# Patient Record
Sex: Male | Born: 1990 | Race: Black or African American | Hispanic: No | Marital: Married | Smoking: Never smoker
Health system: Southern US, Community
[De-identification: ages and names within clinical notes are randomized; demographics above are authoritative.]

## PROBLEM LIST (undated history)

## (undated) DIAGNOSIS — J45909 Unspecified asthma, uncomplicated: Secondary | ICD-10-CM

---

## 2011-09-12 ENCOUNTER — Ambulatory Visit
Admission: RE | Admit: 2011-09-12 | Discharge: 2011-09-12 | Disposition: A | Discharge status: Home / Self Care | Payer: BC Managed Care – PPO | Source: Ambulatory Visit | Attending: Family Medicine | Admitting: Family Medicine

## 2011-09-12 ENCOUNTER — Other Ambulatory Visit: Payer: Self-pay | Admitting: Family Medicine

## 2011-09-12 DIAGNOSIS — M545 Low back pain: Secondary | ICD-10-CM

## 2012-03-21 ENCOUNTER — Encounter (HOSPITAL_COMMUNITY): Payer: Self-pay | Admitting: *Deleted

## 2012-03-21 ENCOUNTER — Emergency Department (HOSPITAL_COMMUNITY)
Admission: EM | Admit: 2012-03-21 | Discharge: 2012-03-21 | Disposition: A | Discharge status: Home / Self Care | Payer: BC Managed Care – PPO | Attending: Emergency Medicine | Admitting: Emergency Medicine

## 2012-03-21 DIAGNOSIS — S139XXA Sprain of joints and ligaments of unspecified parts of neck, initial encounter: Secondary | ICD-10-CM | POA: Insufficient documentation

## 2012-03-21 DIAGNOSIS — M542 Cervicalgia: Secondary | ICD-10-CM | POA: Insufficient documentation

## 2012-03-21 DIAGNOSIS — T148XXA Other injury of unspecified body region, initial encounter: Secondary | ICD-10-CM

## 2012-03-21 HISTORY — DX: Unspecified asthma, uncomplicated: J45.909

## 2012-03-21 MED ORDER — IBUPROFEN 800 MG PO TABS
800.0000 mg | ORAL_TABLET | Freq: Once | ORAL | Status: AC
  Administered 2012-03-21: 800 mg via ORAL
  Filled 2012-03-21: qty 1
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
## 2012-03-21 NOTE — ED Notes (Signed)
Girlfriend at bedside.
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
## 2012-03-22 NOTE — ED Provider Notes (Signed)
Medical screening examination/treatment/procedure(s) were performed by non-physician practitioner and as supervising physician I was immediately available for consultation/collaboration.  Tonnie Friedel, MD 03/22/12 1412 

## 2013-02-05 IMAGING — CR DG LUMBAR SPINE COMPLETE 4+V
5 series · 5 of 5 positions shown · non-contrast
Comparison: None.

CLINICAL DATA: Midline low back pain for several years.  Increasing
severity of back pain.

LUMBAR SPINE - COMPLETE 4+ VIEW

[view not recorded (1 of 5)]
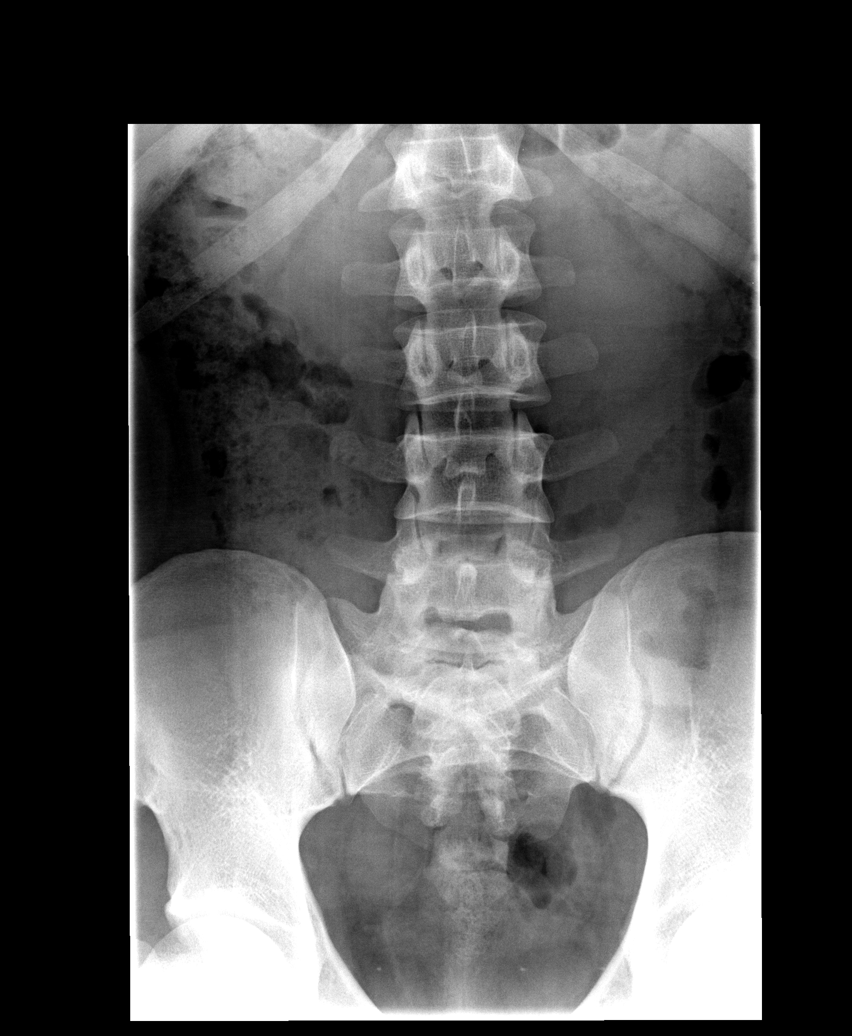

[view not recorded (2 of 5)]
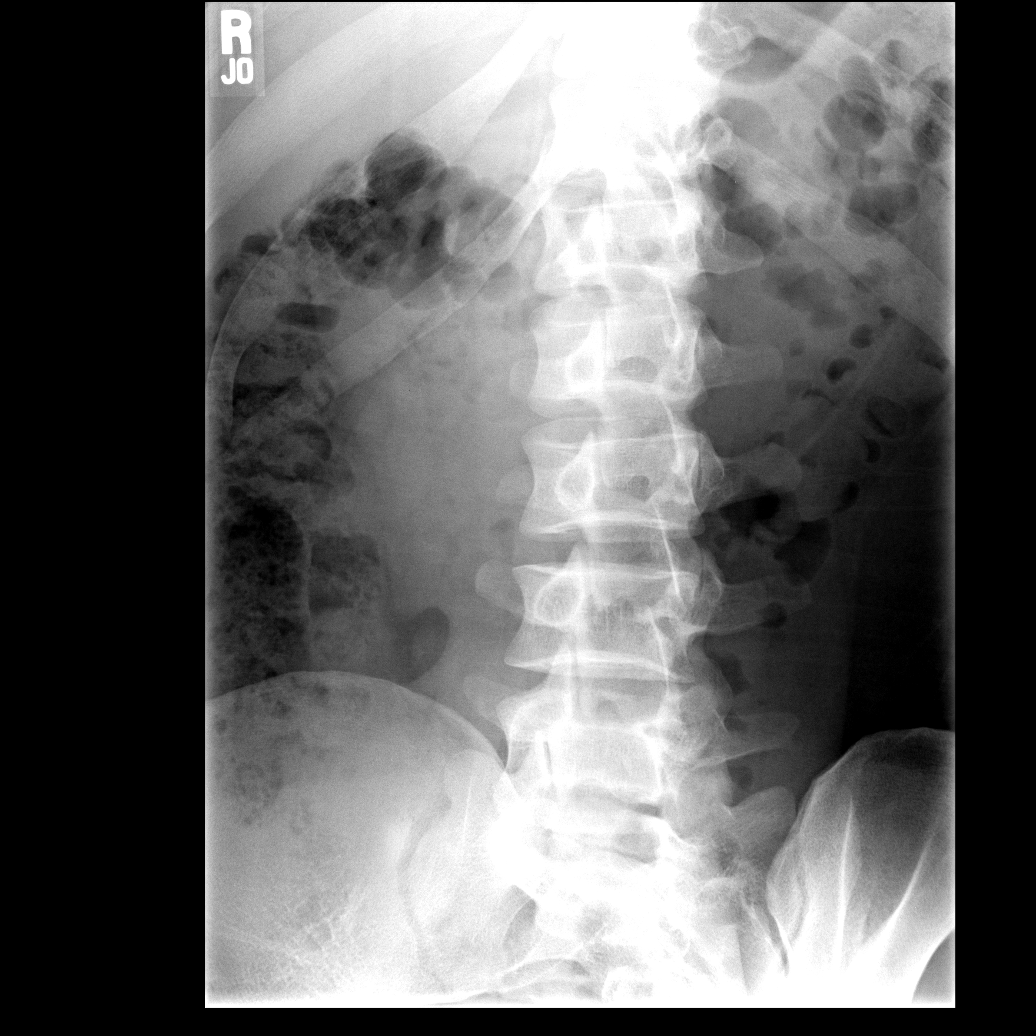

[view not recorded (3 of 5)]
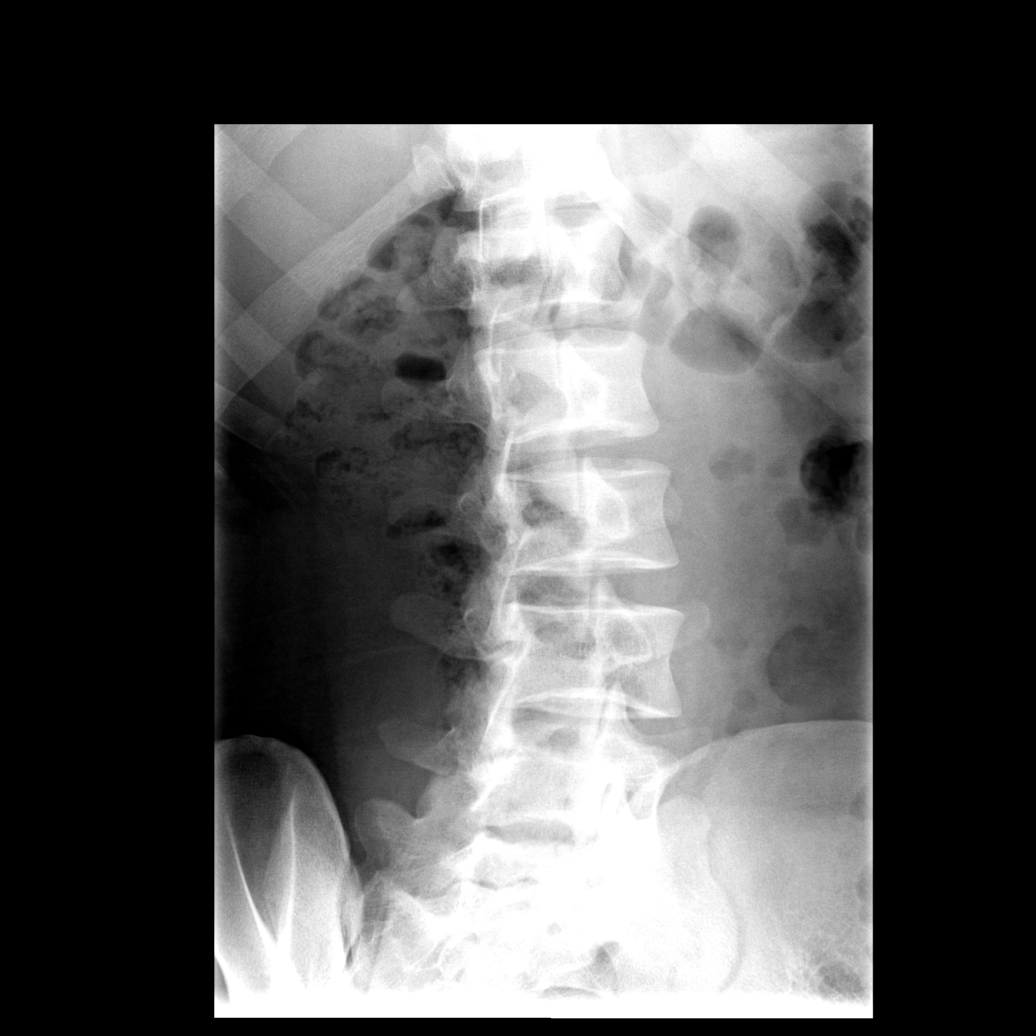

[view not recorded (4 of 5)]
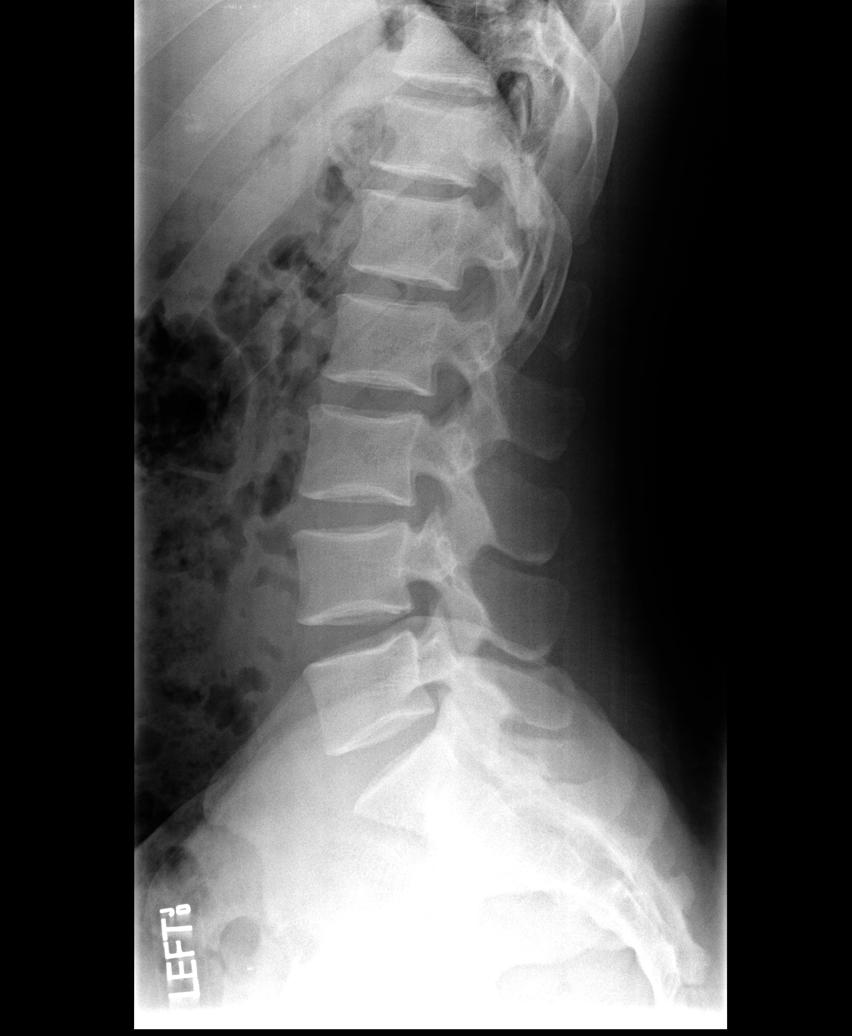

[view not recorded (5 of 5)]
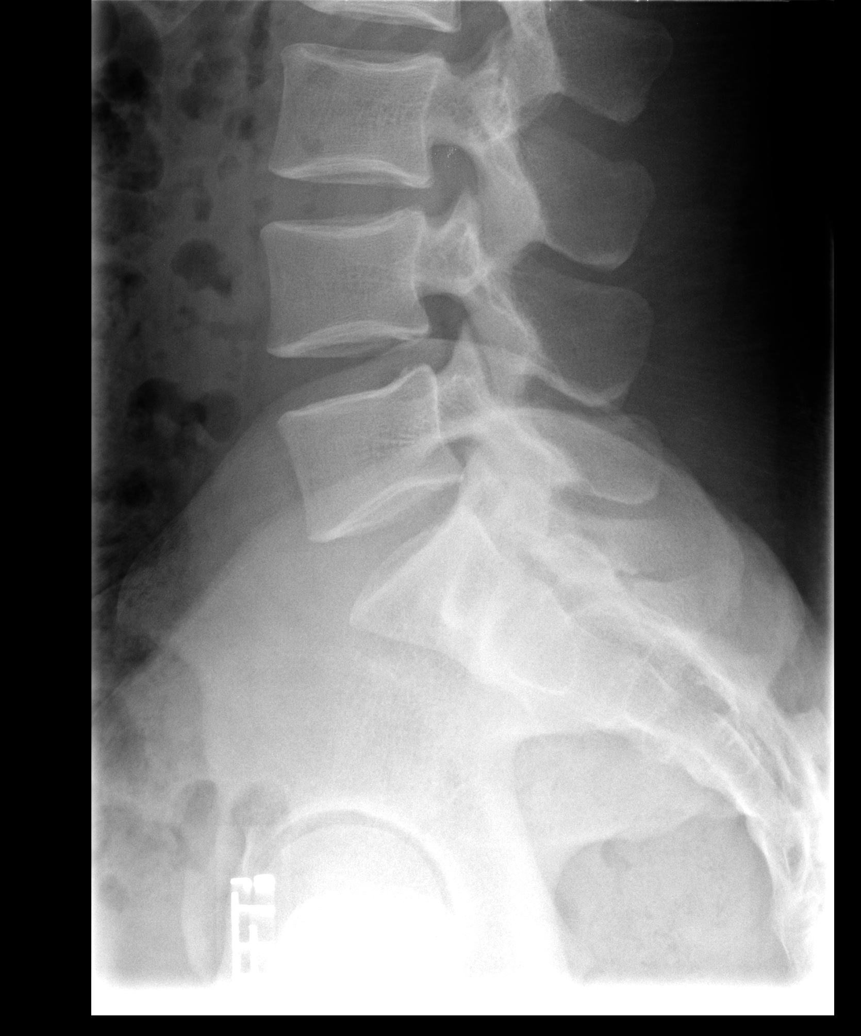

[5 of 5 positions shown; findings below may reference images not displayed]

FINDINGS: Mild levoconvex lumbar curvature.  Five lumbar type
vertebral bodies.  Vertebral body height preserved.  No pars
defects.  Intervertebral disc spaces are normal.  The lumbosacral
junction normal.
IMPRESSION: Negative.

## 2015-10-11 ENCOUNTER — Ambulatory Visit
Admission: EM | Admit: 2015-10-11 | Discharge: 2015-10-11 | Disposition: A | Discharge status: Home / Self Care | Payer: Managed Care, Other (non HMO) | Attending: Family Medicine | Admitting: Family Medicine

## 2015-10-11 DIAGNOSIS — G44209 Tension-type headache, unspecified, not intractable: Secondary | ICD-10-CM

## 2015-10-11 MED ORDER — CYCLOBENZAPRINE HCL 10 MG PO TABS: 10.0000 mg | ORAL_TABLET | Freq: Every day | ORAL | Status: DC

## 2015-10-11 NOTE — ED Provider Notes (Signed)
CSN: [id number]     Arrival date & time 10/11/15  1216 History   First MD Initiated Contact with Patient 10/11/15 1358     Chief Complaint  Patient presents with  . Headache   (Consider location/radiation/quality/duration/timing/severity/associated sxs/prior Treatment) HPI Comments: 26 yo male with a c/o headache ("all over"; "all around my head") since yesterday and noticed neck soreness today. Also states he may have felt some chills today while at work. Denies any fevers, sore throat, nasal congestion, runny nose, cough, chest pains, shortness of breath, vision changes, numbness/tingling.   Patient is a 25 y.o. male presenting with headaches. The history is provided by the patient.  Headache   Past Medical History  Diagnosis Date  . Asthma    History reviewed. No pertinent past surgical history. Family History  Problem Relation Age of Onset  . Cancer Father    Social History  Substance Use Topics  . Smoking status: Never Smoker   . Smokeless tobacco: None  . Alcohol Use: Yes     Comment: occasional    Review of Systems  Neurological: Positive for headaches.    Allergies  Penicillins  Home Medications   Prior to Admission medications   Medication Sig Start Date End Date Taking? Authorizing Provider  cyclobenzaprine (FLEXERIL) 10 MG tablet Take 1 tablet (10 mg total) by mouth at bedtime. 10/11/15   Payton Mccallum, MD   Meds Ordered and Administered this Visit  Medications - No data to display  BP 121/42 mmHg  Pulse 56  Temp(Src) 97.8 F (36.6 C) (Oral)  Resp 16  Ht  (1.753 m)  Wt 225 lb (102.059 kg)  BMI 33.21 kg/m2  SpO2 97% No data found.   Physical Exam  Constitutional: He is oriented to person, place, and time. He appears well-developed and well-nourished. No distress.  HENT:  Head: Normocephalic and atraumatic.  Right Ear: Tympanic membrane, external ear and ear canal normal.  Left Ear: Tympanic membrane, external ear and ear canal normal.   Nose: Nose normal.  Mouth/Throat: Uvula is midline, oropharynx is clear and moist and mucous membranes are normal. No oropharyngeal exudate or tonsillar abscesses.  Eyes: Conjunctivae and EOM are normal. Pupils are equal, round, and reactive to light. Right eye exhibits no discharge. Left eye exhibits no discharge. No scleral icterus.  Neck: Normal range of motion. Neck supple. No tracheal deviation present. No thyromegaly present.  Cardiovascular: Normal rate, regular rhythm and normal heart sounds.   Pulmonary/Chest: Effort normal and breath sounds normal. No stridor. No respiratory distress. He has no wheezes. He has no rales. He exhibits no tenderness.  Lymphadenopathy:    He has no cervical adenopathy.  Neurological: He is alert and oriented to person, place, and time. He displays normal reflexes. No cranial nerve deficit. He exhibits normal muscle tone. Coordination normal.  Skin: Skin is warm and dry. No rash noted. He is not diaphoretic.  Nursing note and vitals reviewed.   ED Course  Procedures (including critical care time)  Labs Review Labs Reviewed - No data to display  Imaging Review No results found.   Visual Acuity Review  Right Eye Distance:   Left Eye Distance:   Bilateral Distance:    Right Eye Near:   Left Eye Near:    Bilateral Near:         MDM   1. Tension headache    Discharge Medication List as of 10/11/2015  2:11 PM    START taking these medications  Details  cyclobenzaprine (FLEXERIL) 10 MG tablet Take 1 tablet (10 mg total) by mouth at bedtime., Starting 10/11/2015, Until Discontinued, Print       1. diagnosis reviewed with patient 2. rx as per orders above; reviewed possible side effects, interactions, risks and benefits  3. Recommend supportive treatment with otc NSAIDs/analgesics prn 4. Follow-up prn if symptoms worsen or don't improve    Payton Mccallumrlando Agustus Mane, MD 10/11/15 1624

## 2015-10-11 NOTE — ED Notes (Signed)
States started yesterday with frontal headache. This am woke with neck soreness and had chills at work.

## 2016-12-02 ENCOUNTER — Encounter: Payer: Self-pay | Admitting: Emergency Medicine

## 2016-12-02 ENCOUNTER — Ambulatory Visit
Admission: EM | Admit: 2016-12-02 | Discharge: 2016-12-02 | Disposition: A | Discharge status: Home / Self Care | Payer: Managed Care, Other (non HMO) | Attending: Family Medicine | Admitting: Family Medicine

## 2016-12-02 DIAGNOSIS — B029 Zoster without complications: Secondary | ICD-10-CM

## 2016-12-02 DIAGNOSIS — R21 Rash and other nonspecific skin eruption: Secondary | ICD-10-CM

## 2016-12-02 MED ORDER — VALACYCLOVIR HCL 1 G PO TABS: 1000.0000 mg | ORAL_TABLET | Freq: Three times a day (TID) | ORAL | 0 refills | Status: AC

## 2016-12-02 NOTE — ED Triage Notes (Signed)
Patient c/o itchy rash on his back that started yesterday.

## 2016-12-02 NOTE — ED Provider Notes (Signed)
[facility]    CSN: [id number] Arrival date & time: 12/02/16  4098  History   Chief Complaint Chief Complaint  Patient presents with  . Rash   HPI  26 year old male presents with complaints of rash Patient states that he's had a rash on his right upper back for the past 2 days. Started suddenly. Associated itching. No known inciting factor. He does note that he's been outside working quite a bit and that he has been under significant stress as of late. No known exacerbating or relieving factors. No medications or interventions tried. No other associated symptoms. No other complaints or concerns at this time.   Past Medical History:  Diagnosis Date  . Asthma    There are no active problems to display for this patient.  History reviewed. No pertinent surgical history.   Home Medications    Prior to Admission medications   Medication Sig Start Date End Date Taking? Authorizing Provider  gentamicin-prednisoLONE 0.3-1 % ophthalmic drops 1 drop 2 (two) times daily.   Yes Historical Provider, MD  valACYclovir (VALTREX) 1000 MG tablet Take 1 tablet (1,000 mg total) by mouth 3 (three) times daily. 12/02/16   Tommie Sams, DO    Family History Family History  Problem Relation Age of Onset  . Cancer Father     Social History Social History  Substance Use Topics  . Smoking status: Never Smoker  . Smokeless tobacco: Never Used  . Alcohol use Yes     Comment: occasional     Allergies   Penicillins   Review of Systems Review of Systems  Constitutional: Negative.   Skin: Positive for rash.   Physical Exam Triage Vital Signs ED Triage Vitals  Enc Vitals Group     BP 12/02/16 0844 126/67     Pulse Rate 12/02/16 0844 67     Resp 12/02/16 0844 16     Temp 12/02/16 0844 97.8 F (36.6 C)     Temp Source 12/02/16 0844 Oral     SpO2 12/02/16 0844 100 %     Weight 12/02/16 0841 230 lb (104.3 kg)     Height 12/02/16 0841  (1.753 m)     Head Circumference  --      Peak Flow --      Pain Score 12/02/16 0842 0     Pain Loc --      Pain Edu? --      Excl. in GC? --     Updated Vital Signs BP 126/67 (BP Location: Left Arm)   Pulse 67   Temp 97.8 F (36.6 C) (Oral)   Resp 16   Ht  (1.753 m)   Wt 230 lb (104.3 kg)   SpO2 100%   BMI 33.97 kg/m   Physical Exam  Constitutional: He is oriented to person, place, and time. He appears well-developed. No distress.  HENT:  Head: Normocephalic and atraumatic.  Eyes: Conjunctivae are normal.  Pulmonary/Chest: Effort normal.  Neurological: He is alert and oriented to person, place, and time.  Skin:  Right upper thoracic region with a large area of vesicular rash. Associated erythema.  Psychiatric: He has a normal mood and affect.  Vitals reviewed.  UC Treatments / Results  Labs (all labs ordered are listed, but only abnormal results are displayed) Labs Reviewed - No data to display  EKG  EKG Interpretation None       Radiology No results found.  Procedures Procedures (including critical care time)  Medications Ordered in UC Medications - No data to display   Initial Impression / Assessment and Plan / UC Course  I have reviewed the triage vital signs and the nursing notes.  Pertinent labs & imaging results that were available during my care of the patient were reviewed by me and considered in my medical decision making (see chart for details).   26 year old male presents with rash. Appears to be consistent with herpes zoster. Treating with Valtrex.  Final Clinical Impressions(s) / UC Diagnoses   Final diagnoses:  Herpes zoster without complication    New Prescriptions Discharge Medication List as of 12/02/2016  8:51 AM    START taking these medications   Details  valACYclovir (VALTREX) 1000 MG tablet Take 1 tablet (1,000 mg total) by mouth 3 (three) times daily., Starting Sat 12/02/2016, Normal         Tommie Sams, DO 12/02/16 [phone]

## 2016-12-02 NOTE — Discharge Instructions (Signed)
Medication as prescribed.  Take care  Dr. Lashanda Storlie
